# Patient Record
Sex: Male | Born: 1965 | Race: White | Hispanic: No | Smoking: Current some day smoker
Health system: Southern US, Community
[De-identification: ages and names within clinical notes are randomized; demographics above are authoritative.]

## PROBLEM LIST (undated history)

## (undated) DIAGNOSIS — K219 Gastro-esophageal reflux disease without esophagitis: Secondary | ICD-10-CM

## (undated) DIAGNOSIS — I10 Essential (primary) hypertension: Secondary | ICD-10-CM

## (undated) DIAGNOSIS — G4730 Sleep apnea, unspecified: Secondary | ICD-10-CM

## (undated) DIAGNOSIS — E785 Hyperlipidemia, unspecified: Secondary | ICD-10-CM

## (undated) DIAGNOSIS — E119 Type 2 diabetes mellitus without complications: Secondary | ICD-10-CM

## (undated) DIAGNOSIS — C6290 Malignant neoplasm of unspecified testis, unspecified whether descended or undescended: Secondary | ICD-10-CM

## (undated) HISTORY — PX: BACK SURGERY: SHX140

## (undated) HISTORY — DX: Gastro-esophageal reflux disease without esophagitis: K21.9

## (undated) HISTORY — DX: Hyperlipidemia, unspecified: E78.5

## (undated) HISTORY — PX: TESTICLE REMOVAL: SHX68

---

## 2003-06-06 ENCOUNTER — Ambulatory Visit (HOSPITAL_COMMUNITY): Admission: RE | Admit: 2003-06-06 | Discharge: 2003-06-07 | Payer: Self-pay | Admitting: Neurosurgery

## 2004-10-13 ENCOUNTER — Ambulatory Visit: Payer: Self-pay | Admitting: Family Medicine

## 2006-08-09 ENCOUNTER — Emergency Department: Payer: Self-pay | Admitting: Emergency Medicine

## 2006-10-05 ENCOUNTER — Ambulatory Visit: Payer: Self-pay | Admitting: Family Medicine

## 2006-10-19 ENCOUNTER — Ambulatory Visit: Payer: Self-pay | Admitting: Family Medicine

## 2011-07-26 ENCOUNTER — Ambulatory Visit: Payer: Self-pay | Admitting: Family Medicine

## 2011-08-04 ENCOUNTER — Ambulatory Visit: Payer: Self-pay | Admitting: Urology

## 2013-07-18 IMAGING — US US PELVIS LIMITED
1 series · 16 of 25 positions shown · non-contrast
Comparison: none

REASON FOR EXAM: CR  [id number]  rt testicular mass swelling pain
COMMENTS:

PROCEDURE:     US  - US TESTICULAR  - July 26, 2011  [DATE]
RESULT:
TECHNIQUE: Grayscale, color-flow Doppler, and SPECTRAL waveform imaging was
performed.

[Series 1: us pelvis limited · 16 of 72 slices shown]
[im 1/72]
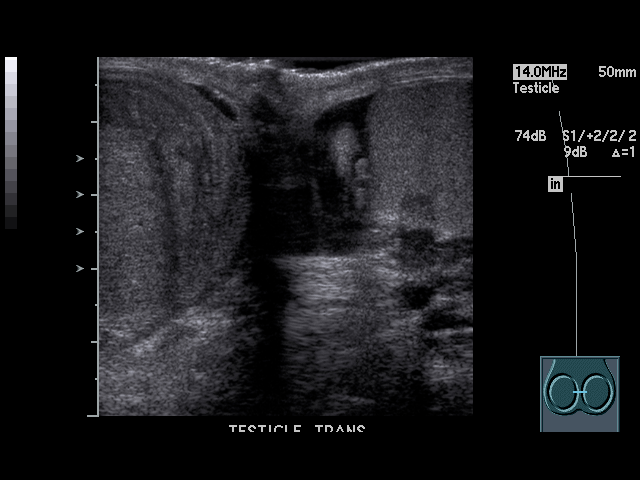
[im 6/72]
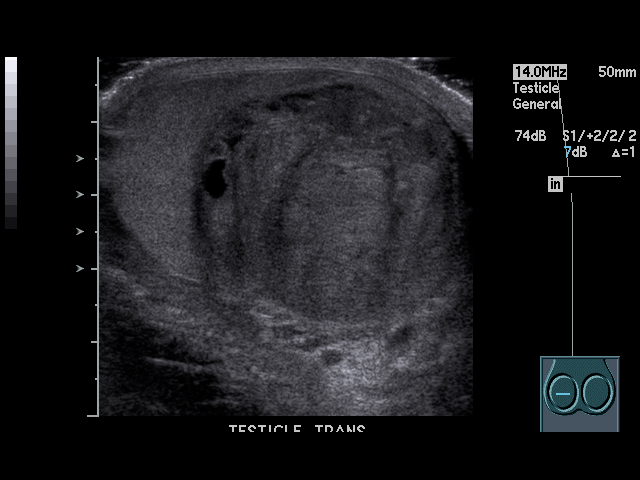
[im 9/72]
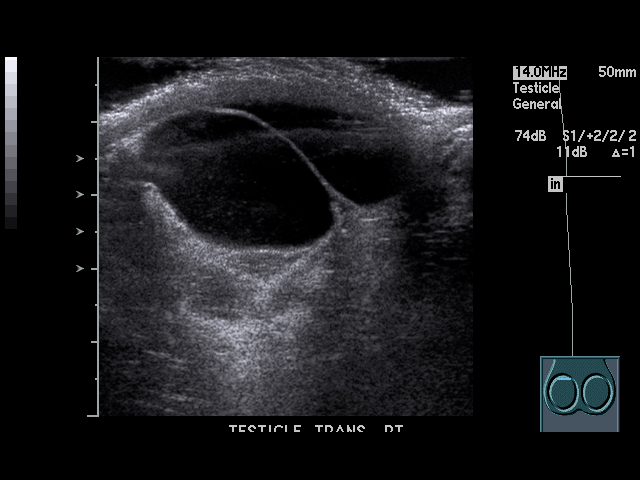
[im 15/72]
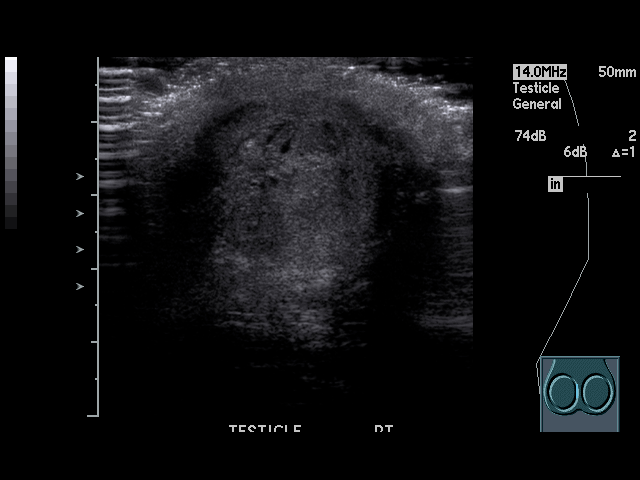
[im 21/72]
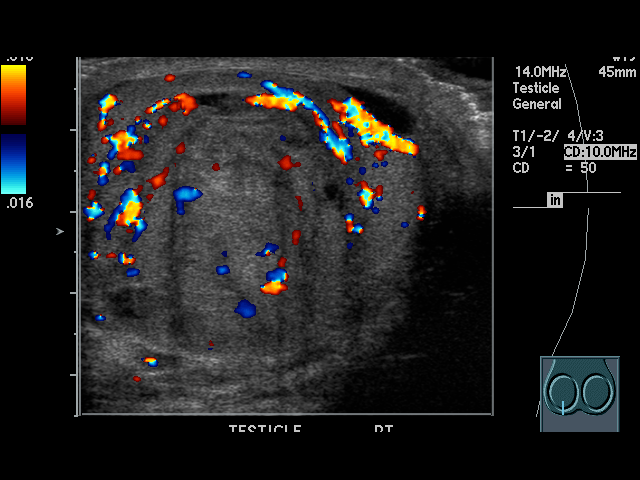
[im 24/72]
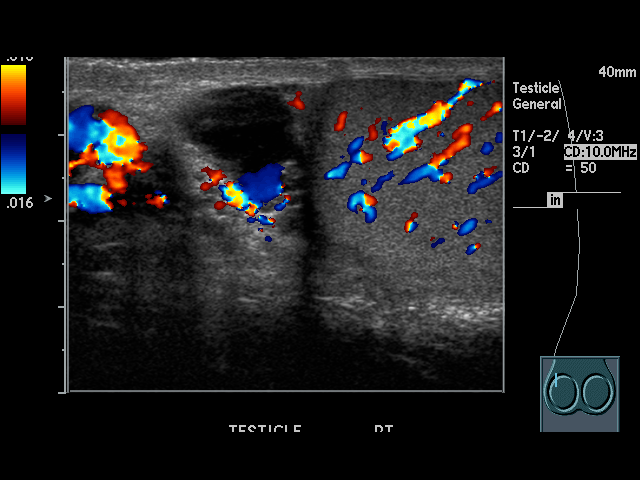
[im 30/72]
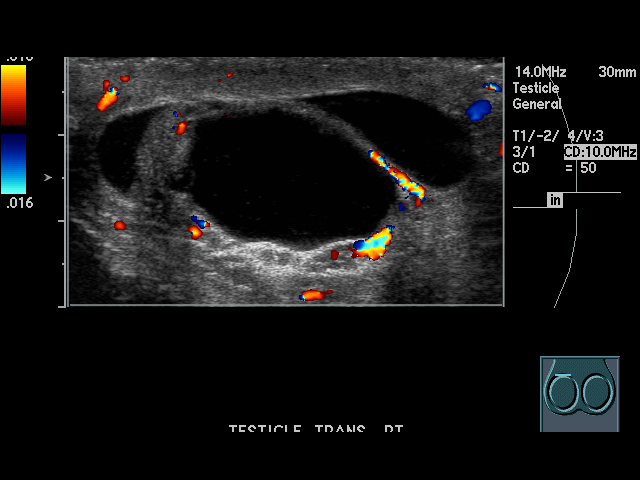
[im 33/72]
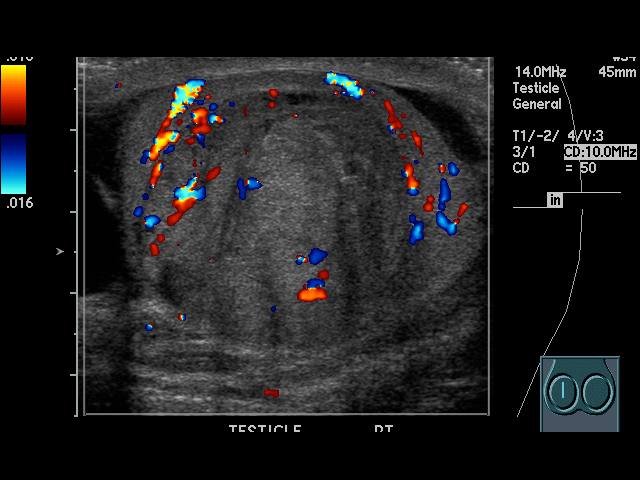
[im 39/72]
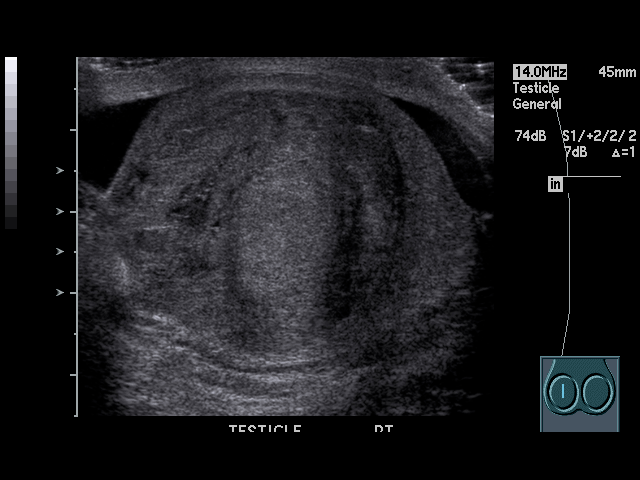
[im 42/72]
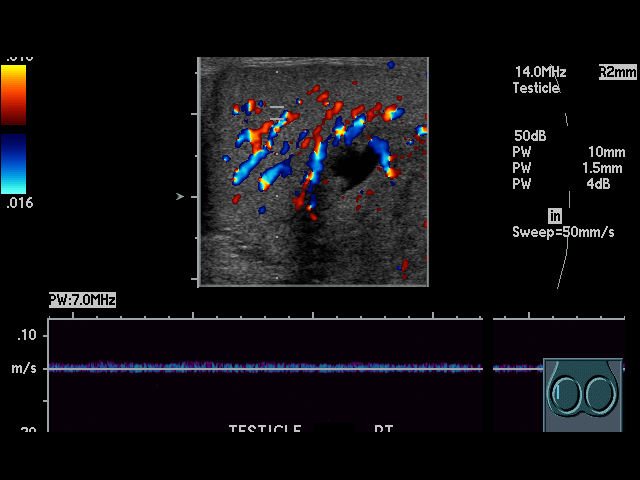
[im 48/72]
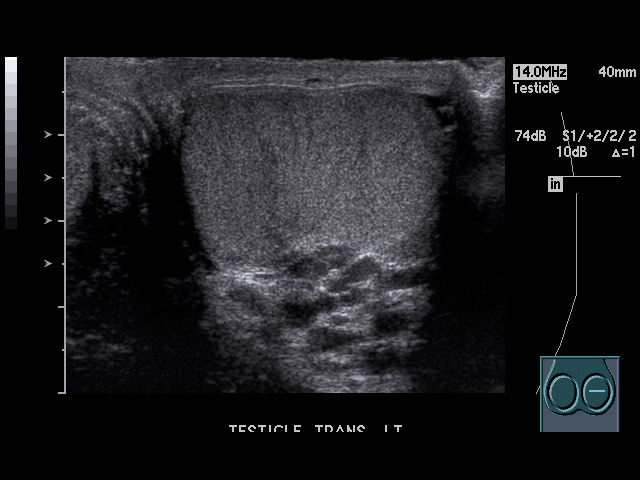
[im 51/72]
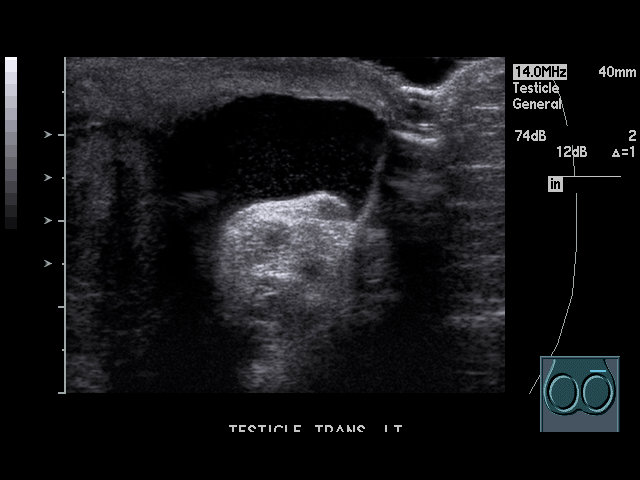
[im 57/72]
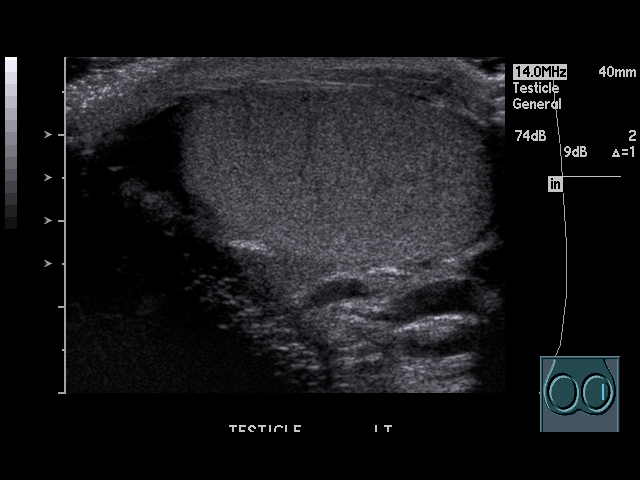
[im 63/72]
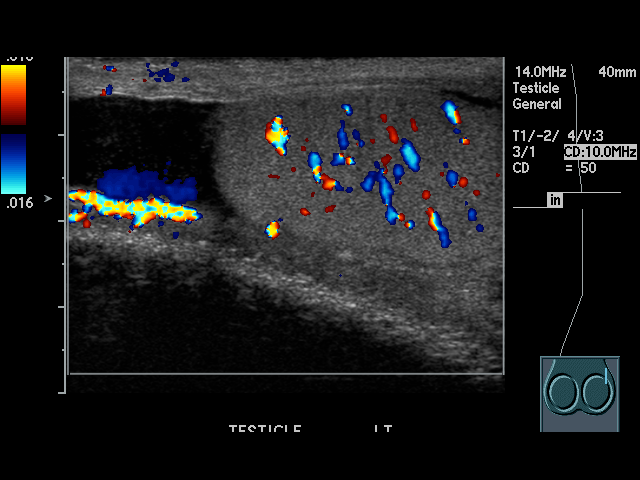
[im 66/72]
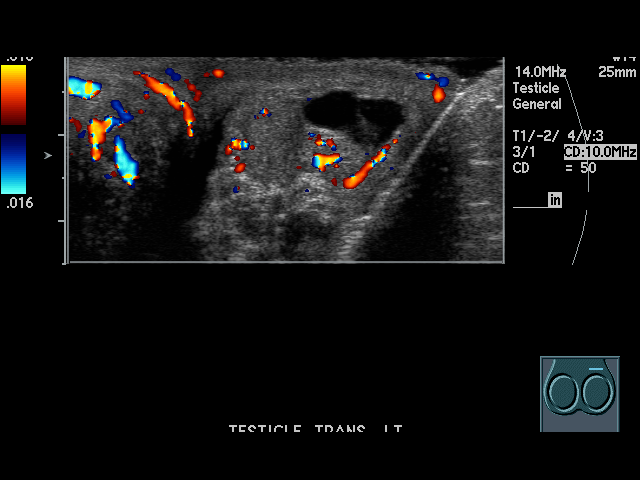
[im 72/72]
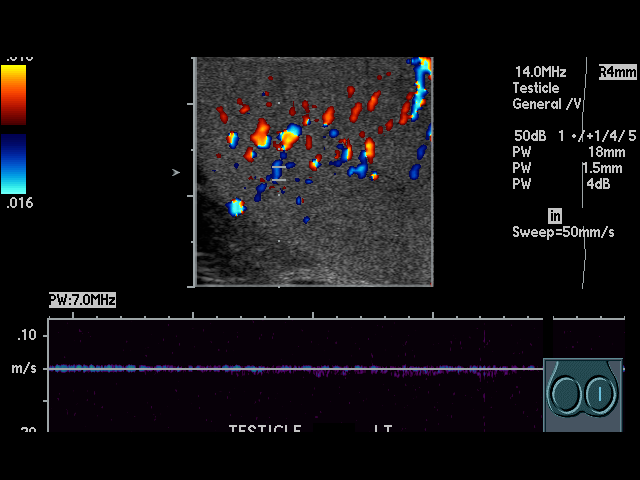

[16 of 25 positions shown; findings below may reference images not displayed]

FINDINGS: The right testicle is enlarged measuring 4.6 x 4.14 x 3.7 cm. A
complicated mass is appreciated within the paracentral portion of the
testicle measuring 3.88 x 3.56 x 3.31 cm. Vascularity is identified within
the mass. There are hypoechoic areas within the mass which may represent
cystic components or possibly sequela of involuting blood products.
Differential considerations are etiologies such as a hematoma particularly
if the patient's has a known history of trauma and infection if clinical
signs and symptoms suggest an infectious etiology. More ominous etiology
such as neoplastic disease benign versus malignant are also of diagnostic
consideration. Urologic consultation is recommended.

The surrounding testicular parenchyma has a more normal appearance. A very
small hydrocele is identified. The epididymis measures 1.06 x 1.38 cm and
contains an epididymal cyst measuring 1.7 x 1.96 x 2.3 cm.

The left hemiscrotum demonstrates a more normal-appearing left testicle with
a homogeneous echotexture and dimensions of 4.26 x 2.27 x 2.25 cm. A small
to moderate hydrocele is identified on the left. The left epididymis
measures 1.12 x 1.78 cm. An epididymal cyst is appreciated measuring 1.02 x
0.58 x 0.5 cm. Homogeneous color flow is identified within the left testicle
as well as arterial and venous waveforms.
IMPRESSION: 1. Right testicular mass with vascularity as described above. Differential
considerations are infection or hematoma in the appropriate clinical
setting. More ominous etiologies cannot be excluded and clinical correlation
as well as urologic consultation recommended.
2. Small bilateral hydroceles.
3. Bilateral epididymal cysts left greater than right.

These findings were telephoned to Dr. [REDACTED] on 07/26/2011 at [DATE]
p.m. to be relayed to Dr. Deziel.

## 2014-09-01 NOTE — Op Note (Signed)
PATIENT NAME:  Glen Glen [name], Glen Glen [name] MR#:  [id number] DATE OF BIRTH:  08-31-1965  DATE OF PROCEDURE:  08/04/2011  PREOPERATIVE DIAGNOSES: Intraparenchymal right testis mass.  POSTOPERATIVE DIAGNOSIS: Intraparenchymal right testis mass.  PROCEDURE: Right radical orchiectomy.  SURGEON: Glen AxonScott Stoioff, MD  ASSISTANT: None.  ANESTHESIA: General.   INDICATION: This is Glen [name] 49 year old male who presented with Glen [name] one month history of mild right testicular pain and approximately two weeks ago noted Glen [name] mass in the right hemiscrotum. There is no previous history of trauma, epididymitis, or orchitis. Physical examination is remarkable for Glen [name] firm mass within the testis proper. Glen [name] testicular ultrasound was remarkable for Glen [name] heterogeneous mass within the paracentral portion of the right testis measuring 3.88 x 3.56 x 3.31 cm. There was vascularity present within the mass by Doppler. Glen [name] metabolic comprehensive metabolic panel showed no significant abnormalities. Beta HCG was elevated at 88 mIU/ml. Alpha-fetoprotein was elevated at 77 ng/mL. He presents for right radical orchiectomy.  DESCRIPTION OF PROCEDURE: The correct site was marked in the staging area. He was then taken to the operating room where Glen [name] general anesthetic was administered via Glen [name] LMA. He was placed in the supine position. His external genitalia and lower abdomen were prepped and draped sterilely. Time out was performed per protocol. An incision was made in the right groin region overlying the approximate area of the internal inguinal ring. The incision was carried through the subcutaneous tissue and Scarpa's fascia down to the external oblique fascia. The external inguinal ring was identified. The external oblique fascia was incised in the direction of its fibers and opened through the external ring. The ilioinguinal nerve was identified, dissected free and retracted laterally with the fascia of the external oblique. At the pubic tubercle, the spermatic cord was  mobilized. Glen [name] was passed beneath it.  The Penrose drain was then doubly placed around the cord and then clamped to achieve compression of the cord. Glen [name] was then inserted through the inguinal incision down into the scrotum and the area was bluntly dissected. The scrotum was then invaginated and testis was delivered into the operative field. Gubernacular fibers were divided. There was one vessel which was ligated with Glen [name] 3-0 Vicryl suture. The cord was then mobilized to the internal inguinal ring. Intraoperative exam was remarkable for Glen [name] firm mass within the right testis. The tunica vaginalis was not opened. Vas was dissected free from the cord at the internal inguinal ring and ligated with 0 Ethibond suture. The remainder of the cord was ligated with 0 Ethibond suture ligatures x2. The cord stump was then pushed back up into the retroperitoneum. The site was irrigated. No bleeding was noted. Hemostasis was adequate. The ilioinguinal nerve was returned to its anatomic position. The external oblique fascia was closed with Glen [name] running 3-0 Vicryl suture. The Scarpa fascia and subcutaneous tissue was closed with interrupted 3-0 Vicryl. The skin was anesthetized with 0.5% plain Sensorcaine. The skin was closed with Glen [name] running subcuticular suture of 5-0 Vicryl. Steri-Strips, Telfa, and Tegaderm were applied. The patient was taken to the PAC-U in stable condition. There were no complications. EBL was minimal.  ____________________________ Glen CzechScott C. Lonna CobbStoioff, MD scs:slb D: 08/04/2011 19:05:16 ET T: 08/05/2011 09:31:54 ET JOB#: [id number]  cc: Glen PicketScott C. Lonna CobbStoioff, MD, <Dictator> Glen CzechSCOTT C STOIOFF MD ELECTRONICALLY SIGNED 08/11/2011 12:00

## 2017-04-04 ENCOUNTER — Encounter: Payer: Self-pay | Admitting: Dietician

## 2017-04-04 ENCOUNTER — Encounter: Payer: BLUE CROSS/BLUE SHIELD | Attending: Family Medicine | Admitting: Dietician

## 2017-04-04 VITALS — BP 118/82 | Ht 72.0 in | Wt 213.3 lb

## 2017-04-04 DIAGNOSIS — E119 Type 2 diabetes mellitus without complications: Secondary | ICD-10-CM

## 2017-04-04 DIAGNOSIS — Z713 Dietary counseling and surveillance: Secondary | ICD-10-CM | POA: Diagnosis not present

## 2017-04-04 NOTE — Progress Notes (Signed)
Diabetes Self-Management Education  Visit Type: First/Initial  Appt. Start Time: 0900 Appt. End Time: 1000  04/04/2017  [name], identified by name and date of birth, is a 51 y.o. male with a diagnosis of Diabetes: Type 2.   ASSESSMENT  Blood pressure 118/82, height 6' (1.829 m), weight 213 lb 4.8 oz (96.8 kg). Body mass index is 28.93 kg/m.  Diabetes Self-Management Education - 04/04/17 1453      Visit Information   Visit Type  First/Initial      Initial Visit   Diabetes Type  Type 2      Health Coping   How would you rate your overall health?  Good      Psychosocial Assessment   Patient Belief/Attitude about Diabetes  Motivated to manage diabetes    Self-care barriers  None    Self-management support  Doctor's office;Family    Other persons present  Patient    Patient Concerns  Weight Control;Medication;Healthy Lifestyle;Glycemic Control become more fit, prevent complications    Special Needs  None    Preferred Learning Style  Visual;Hands on;Auditory    Learning Readiness  Ready    What is the last grade level you completed in school?  2 yr college      Pre-Education Assessment   Patient understands the diabetes disease and treatment process.  Needs Instruction    Patient understands incorporating nutritional management into lifestyle.  Needs Instruction    Patient undertands incorporating physical activity into lifestyle.  Needs Instruction    Patient understands using medications safely.  Needs Instruction    Patient understands monitoring blood glucose, interpreting and using results  Needs Review has Contour Next meter and checks BG's 1-2x/day    Patient understands prevention, detection, and treatment of acute complications.  Needs Instruction    Patient understands prevention, detection, and treatment of chronic complications.  Needs Instruction    Patient understands how to develop strategies to address psychosocial issues.  Needs Instruction    Patient understands how to develop strategies to promote health/change behavior.  Needs Instruction      Complications   Last HgB A1C per patient/outside source  8.5 % 03-08-17    How often do you check your blood sugar?  1-2 times/day    Fasting Blood glucose range (mg/dL)  [id number]    Postprandial Blood glucose range (mg/dL)  130-865;784-696;>295130-179;180-200;>200    Have you had a dilated eye exam in the past 12 months?  No 12-2014    Have you had a dental exam in the past 12 months?  No 2 yr ago    Are you checking your feet?  Yes    How many days per week are you checking your feet?  2      Dietary Intake   Breakfast  eats breakfast 7a-8a; has made recent diet improvements eating less sweets and fried foods and now drinks no drinks with sugar    Snack (morning)  eats nuts or fruit at 9:30a    Lunch  eats lunch at 12p    Snack (afternoon)  eats nuts or fruit at 3p    Dinner  eats super at 5p-6p    Snack (evening)  eats occasional bedtime snack-cereal and milk    Beverage(s)  drinks water 2-3x/day, unsweetened cpffee . doet spdas 2-3x/day and milk 0-1x/day      Exercise   Exercise Type  Light (walking / raking leaves);Strenuous (running)    How many days per week to you exercise?  4.5    How many minutes per day do you exercise?  45    Total minutes per week of exercise  202.5      Patient Education   Previous Diabetes Education  No    Disease state   Definition of diabetes, type 1 and 2, and the diagnosis of diabetes;Explored patient's options for treatment of their diabetes    Nutrition management   Role of diet in the treatment of diabetes and the relationship between the three main macronutrients and blood glucose level;Carbohydrate counting;Meal timing in regards to the patients' current diabetes medication.    Physical activity and exercise   Role of exercise on diabetes management, blood pressure control and cardiac health.;Helped patient identify appropriate exercises in relation to his/her  diabetes, diabetes complications and other health issue.    Medications  Reviewed patients medication for diabetes, action, purpose, timing of dose and side effects.    Monitoring  Purpose and frequency of SMBG.;Interpreting lab values - A1C, lipid, urine microalbumina.;Taught/discussed recording of test results and interpretation of SMBG.;Identified appropriate SMBG and/or A1C goals.;Yearly dilated eye exam    Chronic complications  Relationship between chronic complications and blood glucose control;Retinopathy and reason for yearly dilated eye exams;Lipid levels, blood glucose control and heart disease;Dental care;Nephropathy, what it is, prevention of, the use of ACE, ARB's and early detection of through urine microalbumia.;Reviewed with patient heart disease, higher risk of, and prevention    Psychosocial adjustment  Role of stress on diabetes    Personal strategies to promote health  Lifestyle issues that need to be addressed for better diabetes care;Review risk of smoking and offered smoking cessation;Helped patient develop diabetes management plan for (enter comment)       Individualized Plan for Diabetes Self-Management Training:   Learning Objective:  Patient will have a greater understanding of diabetes self-management. Patient education plan is to attend individual and/or group sessions per assessed needs and concerns.   Plan:   Patient Instructions   Check blood sugars 2 x day before breakfast and 2 hrs after supper every day and record Bring blood sugar records to the next appointment/class Call your doctor for a prescription for:  1. Meter strips (type): Contour Next test strips checking  2 times per day  2. Lancets (type):Microlet lancets checking  2   times per day Exercise:  continue run/walk for    30-60  minutes   4-5 days a week Eat 3 meals day,   1-2  snacks a day Eat 3-4 carbohydrate servings/meal + protein Eat 1 carbohydrate serving/snack + protein Space meals 4-5  hours apart Avoid sugar sweetened drinks (soda, tea, coffee, sports drinks, juices) Limit intake of sweets/desserts and fried foods Limit alcohol:  No more than  2  drinks a day Make dentist / eye doctor appointments Get a Sharps container Encouraged to stop using snuff and smoking cigars Return for appointment/classes on:  05-12-16   Expected Outcomes:   positive  Education material provided: General meal planning Guidelines  If problems or questions, patient to contact team via: [phone]  Future DSME appointment:  05-12-17

## 2017-04-04 NOTE — Patient Instructions (Addendum)
  Check blood sugars 2 x day before breakfast and 2 hrs after supper every day and record Bring blood sugar records to the next appointment/class Call your doctor for a prescription for:  1. Meter strips (type): Contour Next test strips  checking  2 times per day  2. Lancets (type):Microlet lancets checking  2   times per day Exercise:  continue run/walk for    30-60  minutes   4-5 days a week Eat 3 meals day,   1-2  snacks a day Eat 3-4 carbohydrate servings/meal + protein Eat 1 carbohydrate serving/snack + protein Space meals 4-5 hours apart Avoid sugar sweetened drinks (soda, tea, coffee, sports drinks, juices) Limit intake of sweets/desserts and fried foods Limit alcohol:  No more than  2  drinks a day Make dentist / eye doctor appointments Get a Sharps container Return for appointment/classes on:  05-12-16

## 2017-05-12 ENCOUNTER — Telehealth: Payer: Self-pay | Admitting: Dietician

## 2017-05-12 ENCOUNTER — Ambulatory Visit: Payer: BLUE CROSS/BLUE SHIELD

## 2017-05-12 NOTE — Telephone Encounter (Signed)
Patient did not show for his first Diabetes class. Called and left message requesting him to call and reschedule.

## 2017-05-19 ENCOUNTER — Ambulatory Visit: Payer: BLUE CROSS/BLUE SHIELD

## 2017-05-26 ENCOUNTER — Ambulatory Visit: Payer: BLUE CROSS/BLUE SHIELD

## 2017-05-30 ENCOUNTER — Encounter: Payer: Self-pay | Admitting: Dietician

## 2017-05-30 NOTE — Progress Notes (Signed)
Have not heard from pt-called pt but no answer and unable to leave a message

## 2017-06-13 ENCOUNTER — Encounter: Payer: Self-pay | Admitting: Dietician

## 2018-05-28 ENCOUNTER — Other Ambulatory Visit: Payer: Self-pay

## 2018-05-28 ENCOUNTER — Emergency Department: Payer: BLUE CROSS/BLUE SHIELD

## 2018-05-28 ENCOUNTER — Emergency Department
Admission: EM | Admit: 2018-05-28 | Discharge: 2018-05-28 | Disposition: A | Discharge status: Home / Self Care | Payer: BLUE CROSS/BLUE SHIELD | Attending: Emergency Medicine | Admitting: Emergency Medicine

## 2018-05-28 DIAGNOSIS — E785 Hyperlipidemia, unspecified: Secondary | ICD-10-CM | POA: Insufficient documentation

## 2018-05-28 DIAGNOSIS — Z79899 Other long term (current) drug therapy: Secondary | ICD-10-CM | POA: Insufficient documentation

## 2018-05-28 DIAGNOSIS — F17210 Nicotine dependence, cigarettes, uncomplicated: Secondary | ICD-10-CM | POA: Insufficient documentation

## 2018-05-28 DIAGNOSIS — E119 Type 2 diabetes mellitus without complications: Secondary | ICD-10-CM | POA: Insufficient documentation

## 2018-05-28 DIAGNOSIS — I10 Essential (primary) hypertension: Secondary | ICD-10-CM | POA: Diagnosis not present

## 2018-05-28 DIAGNOSIS — R002 Palpitations: Secondary | ICD-10-CM | POA: Insufficient documentation

## 2018-05-28 LAB — URINE DRUG SCREEN, QUALITATIVE (ARMC ONLY)
Amphetamines, Ur Screen: NOT DETECTED
BARBITURATES, UR SCREEN: NOT DETECTED
Benzodiazepine, Ur Scrn: NOT DETECTED
COCAINE METABOLITE, UR ~~LOC~~: NOT DETECTED
Cannabinoid 50 Ng, Ur ~~LOC~~: NOT DETECTED
MDMA (ECSTASY) UR SCREEN: NOT DETECTED
METHADONE SCREEN, URINE: NOT DETECTED
OPIATE, UR SCREEN: NOT DETECTED
Phencyclidine (PCP) Ur S: NOT DETECTED
TRICYCLIC, UR SCREEN: NOT DETECTED

## 2018-05-28 LAB — COMPREHENSIVE METABOLIC PANEL
ALK PHOS: 99 U/L (ref 38–126)
ALT: 29 U/L (ref 0–44)
ANION GAP: 7 (ref 5–15)
AST: 25 U/L (ref 15–41)
Albumin: 4.1 g/dL (ref 3.5–5.0)
BILIRUBIN TOTAL: 0.8 mg/dL (ref 0.3–1.2)
BUN: 13 mg/dL (ref 6–20)
CALCIUM: 8.6 mg/dL — AB (ref 8.9–10.3)
CO2: 25 mmol/L (ref 22–32)
Chloride: 104 mmol/L (ref 98–111)
Creatinine, Ser: 0.78 mg/dL (ref 0.61–1.24)
Glucose, Bld: 263 mg/dL — ABNORMAL HIGH (ref 70–99)
POTASSIUM: 4.1 mmol/L (ref 3.5–5.1)
Sodium: 136 mmol/L (ref 135–145)
TOTAL PROTEIN: 7.3 g/dL (ref 6.5–8.1)

## 2018-05-28 LAB — URINALYSIS, COMPLETE (UACMP) WITH MICROSCOPIC
BILIRUBIN URINE: NEGATIVE
Bacteria, UA: NONE SEEN
Glucose, UA: >=500 mg/dL — AB
HGB URINE DIPSTICK: NEGATIVE
Ketones, ur: 5 mg/dL — AB
Leukocytes, UA: NEGATIVE
Nitrite: NEGATIVE
PH: 6 (ref 5.0–8.0)
Protein, ur: NEGATIVE mg/dL
SPECIFIC GRAVITY, URINE: 1.032 — AB (ref 1.005–1.030)
Squamous Epithelial / LPF: NONE SEEN (ref 0–5)

## 2018-05-28 LAB — CBC WITH DIFFERENTIAL/PLATELET
Abs Immature Granulocytes: 0.01 10*3/uL (ref 0.00–0.07)
BASOS ABS: 0 10*3/uL (ref 0.0–0.1)
Basophils Relative: 0 %
EOS ABS: 0.3 10*3/uL (ref 0.0–0.5)
Eosinophils Relative: 4 %
HEMATOCRIT: 44.5 % (ref 39.0–52.0)
Hemoglobin: 14.3 g/dL (ref 13.0–17.0)
Immature Granulocytes: 0 %
LYMPHS ABS: 2.5 10*3/uL (ref 0.7–4.0)
Lymphocytes Relative: 34 %
MCH: 27.9 pg (ref 26.0–34.0)
MCHC: 32.1 g/dL (ref 30.0–36.0)
MCV: 86.9 fL (ref 80.0–100.0)
MONOS PCT: 8 %
Monocytes Absolute: 0.6 10*3/uL (ref 0.1–1.0)
NRBC: 0 % (ref 0.0–0.2)
Neutro Abs: 3.9 10*3/uL (ref 1.7–7.7)
Neutrophils Relative %: 54 %
Platelets: 267 10*3/uL (ref 150–400)
RBC: 5.12 MIL/uL (ref 4.22–5.81)
RDW: 11.6 % (ref 11.5–15.5)
WBC: 7.3 10*3/uL (ref 4.0–10.5)

## 2018-05-28 LAB — TSH: TSH: 3.12 u[IU]/mL (ref 0.350–4.500)

## 2018-05-28 LAB — T4, FREE: FREE T4: 0.96 ng/dL (ref 0.82–1.77)

## 2018-05-28 LAB — TROPONIN I: Troponin I: <0.03 ng/mL (ref <0.03)

## 2018-05-28 MED ORDER — ATENOLOL 25 MG PO TABS
25.0000 mg | ORAL_TABLET | Freq: Once | ORAL | Status: AC
  Administered 2018-05-28: 25 mg via ORAL
  Filled 2018-05-28: qty 1

## 2018-05-28 MED ORDER — ATENOLOL 25 MG PO TABS: 25.0000 mg | ORAL_TABLET | Freq: Every day | ORAL | 0 refills | Status: AC

## 2018-05-28 NOTE — ED Notes (Signed)
Pt to xray

## 2018-05-28 NOTE — ED Provider Notes (Signed)
[facility] Emergency Department Provider Note   ____________________________________________   First MD Initiated Contact with Patient 05/28/18 [phone]     (approximate)  I have reviewed the triage vital signs and the nursing notes.   HISTORY  Chief Complaint Palpitations    HPI ADRIN [name] is a 53 y.o. male who presents to the ED from home with a chief complaint of palpitations and elevated blood pressure.  Patient has a history of hypertension, diabetes and hyperlipidemia but only takes medicines for his cholesterol as he has weaned himself off his other medicines.  Complains of a 2-day history of palpitations.  Tonight he was feeling palpitations and then a flushed feeling and some tightness in his chest.  Checked his blood pressure which was systolic 170s.  Denies associated diaphoresis, shortness of breath, dizziness, nausea or vomiting.  Drinks 1 cup of coffee every day and sometimes soda.  Denies energy drinks or herbal medications.  Had a cold since Christmas but has not been using decongestants.  Nonproductive cough remains.  Denies associated fever, chills, abdominal pain, dysuria, diarrhea.  Denies recent travel, trauma or hormone use.  Did eat spicy foods for dinner.   Past Medical History:  Diagnosis Date  . GERD (gastroesophageal reflux disease)   . Hyperlipidemia     There are no active problems to display for this patient.   No past surgical history on file.  Prior to Admission medications   Medication Sig Start Date End Date Taking? Authorizing Provider  atenolol (TENORMIN) 25 MG tablet Take 1 tablet (25 mg total) by mouth daily. 05/28/18   Irean Hong, MD  esomeprazole (NEXIUM) 40 MG capsule Take 1 capsule by mouth daily.    [provider]  metFORMIN (GLUCOPHAGE-XR) 500 MG 24 hr tablet Take 2 tablets by mouth daily with supper. 03/30/17   [provider]  rosuvastatin (CRESTOR) 10 MG tablet Take 1 tablet by mouth  daily. 03/10/17   [provider]    Allergies Patient has no known allergies.  Family history CAD  Social History Social History   Tobacco Use  . Smoking status: Current Some Day Smoker    Types: Cigars  . Smokeless tobacco: Current User    Types: Snuff  . Tobacco comment: smokes only rare cigar  Substance Use Topics  . Alcohol use: Yes    Alcohol/week: 4.0 - 5.0 standard drinks    Types: 4 - 5 Shots of liquor per week  . Drug use: Not on file    Review of Systems  Constitutional: No fever/chills Eyes: No visual changes. ENT: No sore throat. Cardiovascular: Positive for chest pain and palpitations. Respiratory: Denies shortness of breath. Gastrointestinal: No abdominal pain.  No nausea, no vomiting.  No diarrhea.  No constipation. Genitourinary: Negative for dysuria. Musculoskeletal: Negative for back pain. Skin: Negative for rash. Neurological: Negative for headaches, focal weakness or numbness.   ____________________________________________   PHYSICAL EXAM:  VITAL SIGNS: ED Triage Vitals  Enc Vitals Group     BP 05/28/18 0044 (!) 182/110     Pulse Rate 05/28/18 0044 97     Resp 05/28/18 0044 14     Temp 05/28/18 0044 98.8 F (37.1 C)     Temp Source 05/28/18 0044 Oral     SpO2 05/28/18 0044 97 %     Weight 05/28/18 0043 215 lb (97.5 kg)     Height 05/28/18 0043 6' (1.829 m)     Head Circumference --  Peak Flow --      Pain Score 05/28/18 0042 1     Pain Loc --      Pain Edu? --      Excl. in GC? --     Constitutional: Alert and oriented.  Anxious appearing and in no acute distress. Eyes: Conjunctivae are normal. PERRL. EOMI. Head: Atraumatic. Nose: No congestion/rhinnorhea. Mouth/Throat: Mucous membranes are moist.  Oropharynx non-erythematous. Neck: No stridor.  No thyromegaly. Cardiovascular: Normal rate, regular rhythm. Grossly normal heart sounds.  Good peripheral circulation. Respiratory: Normal respiratory effort.  No  retractions. Lungs CTAB. Gastrointestinal: Soft and nontender. No distention. No abdominal bruits. No CVA tenderness. Musculoskeletal: No lower extremity tenderness nor edema.  No joint effusions. Neurologic:  Normal speech and language. No gross focal neurologic deficits are appreciated. No gait instability. Skin:  Skin is warm, dry and intact. No rash noted. Psychiatric: Mood and affect are normal. Speech and behavior are normal.  ____________________________________________   LABS (all labs ordered are listed, but only abnormal results are displayed)  Labs Reviewed  COMPREHENSIVE METABOLIC PANEL - Abnormal; Notable for the following components:      Result Value   Glucose, Bld 263 (*)    Calcium 8.6 (*)    All other components within normal limits  URINALYSIS, COMPLETE (UACMP) WITH MICROSCOPIC - Abnormal; Notable for the following components:   Color, Urine YELLOW (*)    APPearance CLEAR (*)    Specific Gravity, Urine 1.032 (*)    Glucose, UA >=500 (*)    Ketones, ur 5 (*)    All other components within normal limits  CBC WITH DIFFERENTIAL/PLATELET  TROPONIN I  TSH  T4, FREE  URINE DRUG SCREEN, QUALITATIVE ([facility] ONLY)  TROPONIN I   ____________________________________________  EKG  ED ECG REPORT I, Omere Marti J, the attending physician, personally viewed and interpreted this ECG.   Date: 05/28/2018  EKG Time: 0046  Rate: 98  Rhythm: normal EKG, normal sinus rhythm  Axis: Normal  Intervals:none  ST&T Change: Nonspecific  ____________________________________________  RADIOLOGY  ED MD interpretation: No acute cardiopulmonary process  Official radiology report(s): Dg Chest 2 View  Result Date: 05/28/2018 CLINICAL DATA:  Palpitations. Chest discomfort. EXAM: CHEST - 2 VIEW COMPARISON:  None. FINDINGS: Mild hyperinflation. Lower cervical spine fixation. Numerous leads and wires project over the chest. Midline trachea. Normal heart size and mediastinal contours. No  pleural effusion or pneumothorax. Clear lungs. No congestive failure. IMPRESSION: No acute cardiopulmonary disease. Electronically Signed   By: Jeronimo GreavesKyle  Talbot M.D.   On: 05/28/2018 01:31    ____________________________________________   PROCEDURES  Procedure(s) performed: None  Procedures  Critical Care performed: No  ____________________________________________   INITIAL IMPRESSION / ASSESSMENT AND PLAN / ED COURSE  As part of my medical decision making, I reviewed the following data within the electronic MEDICAL RECORD NUMBER History obtained from family, Nursing notes reviewed and incorporated, Labs reviewed, EKG interpreted, Old chart reviewed, Radiograph reviewed and Notes from prior ED visits   53 year old male with diabetes and hyperlipidemia who presents with palpitations and elevated blood pressure.  Differential diagnosis includes but is not limited to ACS, thyroid dysfunction, infectious etiology, etc.  Will obtain screening lab work including thyroid panel and troponin, urinalysis, chest x-ray.  Monitor blood pressure and consider antihypertensives.  Clinical Course as of May 28 534  Sun May 28, 2018  0133 Patient resting in no acute distress.  Blood pressure currently 142/95, heart rate 90, room air saturation 99%.   [JS]  1610 Updated patient and spouse of test results thus far.  Nurse drawing times repeat troponin.  Blood pressure currently 129/93.  Voices no complaints.   [JS]  0402 Repeat timed troponin remains unremarkable.  Discussed with patient and he desires me to prescribe antihypertensive.  I also told him he should probably resume his metformin.  He will speak to his doctor about this.  Also asked him to keep a blood pressure log to bring to his PCP.  Strict return precautions given.  Patient and spouse verbalize understanding and agree with plan of care.   [JS]    Clinical Course User Index [JS] Irean Hong, MD      ____________________________________________   FINAL CLINICAL IMPRESSION(S) / ED DIAGNOSES  Final diagnoses:  Palpitations  Essential hypertension     ED Discharge Orders         Ordered    atenolol (TENORMIN) 25 MG tablet  Daily     05/28/18 0415           Note:  This document was prepared using Dragon voice recognition software and may include unintentional dictation errors.    Irean Hong, MD 05/28/18 [phone]

## 2018-05-28 NOTE — ED Triage Notes (Signed)
Reports symptoms since Friday with palpations and off/on Saturday.  Tonight having palpations and then a "heat/flushed feeling" and checked BP and it was elevated and felt uncomfortable in the chest.

## 2018-05-28 NOTE — Discharge Instructions (Signed)
Start Atenolol 25mg  daily for your blood pressure. Please keep a log of your blood pressures morning, noon and evening. Take this to your doctor. Also speak with your doctor about restarting diabetes medicine. Return to the ER for worsening symptoms, persistent vomiting, difficulty breathing or other concerns.

## 2018-05-28 NOTE — ED Notes (Signed)
Pt returned from xray

## 2022-12-07 ENCOUNTER — Encounter: Payer: Self-pay | Admitting: Gastroenterology

## 2022-12-13 ENCOUNTER — Encounter
Admission: RE | Disposition: A | Discharge status: Home / Self Care | Payer: Self-pay | Source: Home / Self Care | Attending: Gastroenterology

## 2022-12-13 ENCOUNTER — Ambulatory Visit: Payer: BC Managed Care – PPO | Admitting: Anesthesiology

## 2022-12-13 ENCOUNTER — Ambulatory Visit
Admission: RE | Admit: 2022-12-13 | Discharge: 2022-12-13 | Disposition: A | Discharge status: Home / Self Care | Payer: BC Managed Care – PPO | Attending: Gastroenterology | Admitting: Gastroenterology

## 2022-12-13 ENCOUNTER — Encounter: Payer: Self-pay | Admitting: Gastroenterology

## 2022-12-13 DIAGNOSIS — E785 Hyperlipidemia, unspecified: Secondary | ICD-10-CM | POA: Insufficient documentation

## 2022-12-13 DIAGNOSIS — E119 Type 2 diabetes mellitus without complications: Secondary | ICD-10-CM | POA: Diagnosis not present

## 2022-12-13 DIAGNOSIS — Z1211 Encounter for screening for malignant neoplasm of colon: Secondary | ICD-10-CM | POA: Insufficient documentation

## 2022-12-13 DIAGNOSIS — F17200 Nicotine dependence, unspecified, uncomplicated: Secondary | ICD-10-CM | POA: Diagnosis not present

## 2022-12-13 DIAGNOSIS — D122 Benign neoplasm of ascending colon: Secondary | ICD-10-CM | POA: Insufficient documentation

## 2022-12-13 DIAGNOSIS — G4730 Sleep apnea, unspecified: Secondary | ICD-10-CM | POA: Insufficient documentation

## 2022-12-13 DIAGNOSIS — Z8547 Personal history of malignant neoplasm of testis: Secondary | ICD-10-CM | POA: Diagnosis not present

## 2022-12-13 DIAGNOSIS — K5730 Diverticulosis of large intestine without perforation or abscess without bleeding: Secondary | ICD-10-CM | POA: Diagnosis not present

## 2022-12-13 DIAGNOSIS — I10 Essential (primary) hypertension: Secondary | ICD-10-CM | POA: Insufficient documentation

## 2022-12-13 DIAGNOSIS — K635 Polyp of colon: Secondary | ICD-10-CM | POA: Diagnosis not present

## 2022-12-13 DIAGNOSIS — D128 Benign neoplasm of rectum: Secondary | ICD-10-CM | POA: Insufficient documentation

## 2022-12-13 DIAGNOSIS — K219 Gastro-esophageal reflux disease without esophagitis: Secondary | ICD-10-CM | POA: Diagnosis not present

## 2022-12-13 DIAGNOSIS — D123 Benign neoplasm of transverse colon: Secondary | ICD-10-CM | POA: Diagnosis not present

## 2022-12-13 DIAGNOSIS — Z7984 Long term (current) use of oral hypoglycemic drugs: Secondary | ICD-10-CM | POA: Insufficient documentation

## 2022-12-13 DIAGNOSIS — Z9079 Acquired absence of other genital organ(s): Secondary | ICD-10-CM | POA: Diagnosis not present

## 2022-12-13 HISTORY — DX: Type 2 diabetes mellitus without complications: E11.9

## 2022-12-13 HISTORY — PX: HEMOSTASIS CLIP PLACEMENT: SHX6857

## 2022-12-13 HISTORY — PX: POLYPECTOMY: SHX5525

## 2022-12-13 HISTORY — DX: Essential (primary) hypertension: I10

## 2022-12-13 HISTORY — PX: COLONOSCOPY WITH PROPOFOL: SHX5780

## 2022-12-13 HISTORY — DX: Sleep apnea, unspecified: G47.30

## 2022-12-13 HISTORY — DX: Malignant neoplasm of unspecified testis, unspecified whether descended or undescended: C62.90

## 2022-12-13 LAB — GLUCOSE, CAPILLARY: Glucose-Capillary: 158 mg/dL — ABNORMAL HIGH (ref 70–99)

## 2022-12-13 SURGERY — COLONOSCOPY WITH PROPOFOL
Anesthesia: General

## 2022-12-13 MED ORDER — LIDOCAINE HCL (CARDIAC) PF 100 MG/5ML IV SOSY
PREFILLED_SYRINGE | INTRAVENOUS | Status: DC | PRN
  Administered 2022-12-13: 80 mg via INTRAVENOUS

## 2022-12-13 MED ORDER — SODIUM CHLORIDE 0.9 % IV SOLN
INTRAVENOUS | Status: DC
  Administered 2022-12-13: 20 mL/h via INTRAVENOUS

## 2022-12-13 MED ORDER — DEXMEDETOMIDINE HCL IN NACL 80 MCG/20ML IV SOLN
INTRAVENOUS | Status: AC
  Filled 2022-12-13: qty 20

## 2022-12-13 MED ORDER — DEXMEDETOMIDINE HCL IN NACL 200 MCG/50ML IV SOLN
INTRAVENOUS | Status: DC | PRN
Start: 2022-12-13 — End: 2022-12-13
  Administered 2022-12-13: 12 ug via INTRAVENOUS
  Administered 2022-12-13: 8 ug via INTRAVENOUS

## 2022-12-13 MED ORDER — PROPOFOL 1000 MG/100ML IV EMUL
INTRAVENOUS | Status: AC
  Filled 2022-12-13: qty 100

## 2022-12-13 MED ORDER — PROPOFOL 500 MG/50ML IV EMUL
INTRAVENOUS | Status: DC | PRN
  Administered 2022-12-13: 125 ug/kg/min via INTRAVENOUS

## 2022-12-13 MED ORDER — LIDOCAINE HCL (PF) 2 % IJ SOLN
INTRAMUSCULAR | Status: AC
  Filled 2022-12-13: qty 5

## 2022-12-13 MED ORDER — PROPOFOL 10 MG/ML IV BOLUS
INTRAVENOUS | Status: DC | PRN
Start: 2022-12-13 — End: 2022-12-13
  Administered 2022-12-13: 50 mg via INTRAVENOUS
  Administered 2022-12-13: 20 mg via INTRAVENOUS

## 2022-12-13 NOTE — Anesthesia Preprocedure Evaluation (Addendum)
Anesthesia Evaluation  Patient identified by MRN, date of birth, ID band Patient awake    Reviewed: Allergy & Precautions, H&P , NPO status , Patient's Chart, lab work & pertinent test results  Airway Mallampati: II  TM Distance: >3 FB Neck ROM: full    Dental no notable dental hx.    Pulmonary sleep apnea , Current Smoker   Pulmonary exam normal        Cardiovascular hypertension, Normal cardiovascular exam     Neuro/Psych negative neurological ROS  negative psych ROS   GI/Hepatic Neg liver ROS,GERD  ,,  Endo/Other  diabetes    Renal/GU negative Renal ROS  negative genitourinary   Musculoskeletal   Abdominal Normal abdominal exam  (+)   Peds  Hematology negative hematology ROS (+)   Anesthesia Other Findings Past Medical History: No date: Diabetes mellitus without complication (HCC) No date: GERD (gastroesophageal reflux disease) No date: Hyperlipidemia No date: Hypertension No date: Sleep apnea No date: Testicular cancer [facility])  Past Surgical History: No date: BACK SURGERY No date: TESTICLE REMOVAL     Reproductive/Obstetrics negative OB ROS                              Anesthesia Physical Anesthesia Plan  ASA: 2  Anesthesia Plan: General   Post-op Pain Management:    Induction:   PONV Risk Score and Plan: Propofol infusion and TIVA  Airway Management Planned: Natural Airway  Additional Equipment:   Intra-op Plan:   Post-operative Plan:   Informed Consent: I have reviewed the patients History and Physical, chart, labs and discussed the procedure including the risks, benefits and alternatives for the proposed anesthesia with the patient or authorized representative who has indicated his/her understanding and acceptance.     Dental Advisory Given  Plan Discussed with: CRNA and Surgeon  Anesthesia Plan Comments:          Anesthesia Quick Evaluation

## 2022-12-13 NOTE — Interval H&P Note (Signed)
History and Physical Interval Note: Preprocedure H&P from 12/13/22  was reviewed and there was no interval change after seeing and examining the patient.  Written consent was obtained from the patient after discussion of risks, benefits, and alternatives. Patient has consented to proceed with Colonoscopy with possible intervention   12/13/2022 7:27 AM  Glen [name]  has presented today for surgery, with the diagnosis of Z12.11 (ICD-10-CM) - Colon cancer screening.  The various methods of treatment have been discussed with the patient and family. After consideration of risks, benefits and other options for treatment, the patient has consented to  Procedure(s) with comments: COLONOSCOPY WITH PROPOFOL (N/A) - DM as a surgical intervention.  The patient's history has been reviewed, patient examined, no change in status, stable for surgery.  I have reviewed the patient's chart and labs.  Questions were answered to the patient's satisfaction.     [name]

## 2022-12-13 NOTE — Transfer of Care (Signed)
Immediate Anesthesia Transfer of Care Note  Patient: Glen [name]  Procedure(s) Performed: COLONOSCOPY WITH PROPOFOL HEMOSTASIS CLIP PLACEMENT POLYPECTOMY  Patient Location: PACU  Anesthesia Type:General  Level of Consciousness: sedated  Airway & Oxygen Therapy: Patient Spontanous Breathing and Patient connected to nasal cannula oxygen  Post-op Assessment: Report given to RN and Post -op Vital signs reviewed and stable  Post vital signs: Reviewed and stable  Last Vitals:  Vitals Value Taken Time  BP 84/73 12/13/22 0807  Temp 36.1 C 12/13/22 0807  Pulse 82 12/13/22 0807  Resp 15 12/13/22 0807  SpO2 99 % 12/13/22 0807  Vitals shown include unfiled device data.  Last Pain:  Vitals:   12/13/22 0807  TempSrc: Tympanic  PainSc: Asleep         Complications: No notable events documented.

## 2022-12-13 NOTE — Op Note (Signed)
[facility] Gastroenterology Patient Name: Glen [name] Procedure Date: 12/13/2022 7:28 AM MRN: [id number] Account #: [id number] Date of Birth: 31-May-1965 Admit Type: Outpatient Age: 57 Room: [facility] ENDO ROOM 2 Gender: Male Note Status: Finalized Instrument Name: Colonoscope [id number] Procedure:             Colonoscopy Indications:           Screening for colorectal malignant neoplasm Providers:             Trenda Moots, DO Referring MD:          Rhona Leavens. Burnett Sheng, MD (Referring MD) Medicines:             Monitored Anesthesia Care Complications:         No immediate complications. Estimated blood loss:                         Minimal. Procedure:             Pre-Anesthesia Assessment:                        - Prior to the procedure, a History and Physical was                         performed, and patient medications and allergies were                         reviewed. The patient is competent. The risks and                         benefits of the procedure and the sedation options and                         risks were discussed with the patient. All questions                         were answered and informed consent was obtained.                         Patient identification and proposed procedure were                         verified by the physician, the nurse, the anesthetist                         and the technician in the endoscopy suite. Mental                         Status Examination: alert and oriented. Airway                         Examination: normal oropharyngeal airway and neck                         mobility. Respiratory Examination: clear to                         auscultation. CV Examination: RRR, no murmurs, no S3  or S4. Prophylactic Antibiotics: The patient does not                         require prophylactic antibiotics. Prior                         Anticoagulants: The patient has taken no anticoagulant                          or antiplatelet agents. ASA Grade Assessment: II - A                         patient with mild systemic disease. After reviewing                         the risks and benefits, the patient was deemed in                         satisfactory condition to undergo the procedure. The                         anesthesia plan was to use monitored anesthesia care                         (MAC). Immediately prior to administration of                         medications, the patient was re-assessed for adequacy                         to receive sedatives. The heart rate, respiratory                         rate, oxygen saturations, blood pressure, adequacy of                         pulmonary ventilation, and response to care were                         monitored throughout the procedure. The physical                         status of the patient was re-assessed after the                         procedure.                        After obtaining informed consent, the colonoscope was                         passed under direct vision. Throughout the procedure,                         the patient's blood pressure, pulse, and oxygen                         saturations were monitored continuously. The  Colonoscope was introduced through the anus and                         advanced to the the terminal ileum, with                         identification of the appendiceal orifice and IC                         valve. The colonoscopy was performed without                         difficulty. The patient tolerated the procedure well.                         The quality of the bowel preparation was evaluated                         using the BBPS [facility] Bowel Preparation Scale) with                         scores of: Right Colon = 3, Transverse Colon = 3 and                         Left Colon = 3 (entire mucosa seen well with no                         residual  staining, small fragments of stool or opaque                         liquid). The total BBPS score equals 9. The terminal                         ileum, ileocecal valve, appendiceal orifice, and                         rectum were photographed. Findings:      The perianal and digital rectal examinations were normal. Pertinent       negatives include normal sphincter tone.      The terminal ileum appeared normal. Estimated blood loss: none.      Retroflexion in the right colon was performed.      Multiple small-mouthed diverticula were found in the recto-sigmoid colon       and sigmoid colon. Estimated blood loss: none.      An 11 to 12 mm polyp was found in the rectum. The polyp was       pedunculated. The polyp was removed with a hot snare. Resection and       retrieval were complete. To prevent bleeding after the polypectomy, one       hemostatic clip was successfully placed (MR conditional). There was no       bleeding at the end of the procedure. Estimated blood loss was minimal.      Two sessile polyps were found in the rectum and sigmoid colon. The       polyps were 3 to 6 mm in size. These polyps were removed with a cold       snare. Resection and retrieval were  complete. Estimated blood loss was       minimal.      Seven sessile polyps were found in the rectum (5), transverse colon (1)       and ascending colon (1). The polyps were 1 to 2 mm in size. These polyps       were removed with a jumbo cold forceps. Resection and retrieval were       complete. Estimated blood loss was minimal.      The exam was otherwise without abnormality on direct and retroflexion       views. Impression:            - The examined portion of the ileum was normal.                        - Diverticulosis in the recto-sigmoid colon and in the                         sigmoid colon.                        - One 11 to 12 mm polyp in the rectum, removed with a                         hot snare. Resected and  retrieved. Clip (MR                         conditional) was placed.                        - Two 3 to 6 mm polyps in the rectum and in the                         sigmoid colon, removed with a cold snare. Resected and                         retrieved.                        - Seven 1 to 2 mm polyps in the rectum, in the                         transverse colon and in the ascending colon, removed                         with a jumbo cold forceps. Resected and retrieved.                        - The examination was otherwise normal on direct and                         retroflexion views. Recommendation:        - Patient has a contact number available for                         emergencies. The signs and symptoms of potential                         delayed complications were  discussed with the patient.                         Return to normal activities tomorrow. Written                         discharge instructions were provided to the patient.                        - Discharge patient to home.                        - Resume previous diet.                        - Continue present medications.                        - No ibuprofen, naproxen, or other non-steroidal                         anti-inflammatory drugs for 5 days after polyp removal.                        - Await pathology results.                        - Repeat colonoscopy in 3 years for surveillance based                         on pathology results.                        - Return to GI office as previously scheduled.                        - The findings and recommendations were discussed with                         the patient. Procedure Code(s):     --- Professional ---                        [phone], Colonoscopy, flexible; with removal of                         tumor(s), polyp(s), or other lesion(s) by snare                         technique                        [id number], 59, Colonoscopy, flexible; with biopsy,  single                         or multiple Diagnosis Code(s):     --- Professional ---                        Z12.11, Encounter for screening for malignant neoplasm                         of colon  D12.8, Benign neoplasm of rectum                        D12.5, Benign neoplasm of sigmoid colon                        D12.3, Benign neoplasm of transverse colon (hepatic                         flexure or splenic flexure)                        D12.2, Benign neoplasm of ascending colon                        K57.30, Diverticulosis of large intestine without                         perforation or abscess without bleeding CPT copyright 2022 American Medical Association. All rights reserved. The codes documented in this report are preliminary and upon coder review may  be revised to meet current compliance requirements. Attending Participation:      I personally performed the entire procedure. Elfredia Nevins, DO [name] DO, DO 12/13/2022 8:09:11 AM This report has been signed electronically. Number of Addenda: 0 Note Initiated On: 12/13/2022 7:28 AM Scope Withdrawal Time: 0 hours 18 minutes 56 seconds  Total Procedure Duration: 0 hours 23 minutes 50 seconds  Estimated Blood Loss:  Estimated blood loss was minimal.      [facility]

## 2022-12-13 NOTE — H&P (Signed)
Pre-Procedure H&P   Patient ID: Glen [name] is a 57 y.o. male.  Gastroenterology Provider: [name], DO  Referring Provider: Dr. Burnett Sheng PCP: Jerl Mina, MD  Date: 12/13/2022  HPI [name] is a 57 y.o. male who presents today for Colonoscopy for Colorectal cancer screening.  Undergoing initial colonoscopy today.  No family history of colon cancer or colon polyps.  Reports daily bowel movements without melena or hematochezia  Patient's last dose of Mounjaro was July 29.  Hemoglobin 15.8 MCV 89 platelets 252,000  No other acute GI complaints   Past Medical History:  Diagnosis Date   Diabetes mellitus without complication (HCC)    GERD (gastroesophageal reflux disease)    Hyperlipidemia    Hypertension    Sleep apnea    Testicular cancer (HCC)     Past Surgical History:  Procedure Laterality Date   BACK SURGERY     TESTICLE REMOVAL      Family History No h/o GI disease or malignancy  Review of Systems  Constitutional:  Negative for activity change, appetite change, chills, diaphoresis, fatigue, fever and unexpected weight change.  HENT:  Negative for trouble swallowing and voice change.   Respiratory:  Negative for shortness of breath and wheezing.   Cardiovascular:  Negative for chest pain, palpitations and leg swelling.  Gastrointestinal:  Negative for abdominal distention, abdominal pain, anal bleeding, blood in stool, constipation, diarrhea, nausea and vomiting.  Musculoskeletal:  Negative for arthralgias and myalgias.  Skin:  Negative for color change and pallor.  Neurological:  Negative for dizziness, syncope and weakness.  Psychiatric/Behavioral:  Negative for confusion. The patient is not nervous/anxious.   All other systems reviewed and are negative.    Medications No current facility-administered medications on file prior to encounter.   Current Outpatient Medications on File Prior to Encounter  Medication Sig  Dispense Refill   atenolol (TENORMIN) 25 MG tablet Take 1 tablet (25 mg total) by mouth daily. 30 tablet 0   esomeprazole (NEXIUM) 40 MG capsule Take 1 capsule by mouth daily.     rosuvastatin (CRESTOR) 10 MG tablet Take 1 tablet by mouth daily.     tirzepatide [facility]) 5 MG/0.5ML Pen Inject 5 mg into the skin once a week.     metFORMIN (GLUCOPHAGE-XR) 500 MG 24 hr tablet Take 2 tablets by mouth daily with supper. (Patient not taking: Reported on 12/07/2022)      Pertinent medications related to GI and procedure were reviewed by me with the patient prior to the procedure   Current Facility-Administered Medications:    0.9 %  sodium chloride infusion, , Intravenous, Continuous, [name], DO, Last Rate: 20 mL/hr at 12/13/22 0707, 20 mL/hr at 12/13/22 1914  sodium chloride 20 mL/hr (12/13/22 0707)       No Known Allergies Allergies were reviewed by me prior to the procedure  Objective   Body mass index is 27.8 kg/m. Vitals:   12/13/22 0655  BP: (!) 150/110  Pulse: 77  Resp: 20  Temp: (!) 97.2 F (36.2 C)  TempSrc: Temporal  SpO2: 100%  Weight: 93 kg  Height: 6' (1.829 m)     Physical Exam Vitals and nursing note reviewed.  Constitutional:      General: He is not in acute distress.    Appearance: Normal appearance. He is not ill-appearing, toxic-appearing or diaphoretic.  HENT:     Head: Normocephalic and atraumatic.     Nose: Nose normal.  Mouth/Throat:     Mouth: Mucous membranes are moist.     Pharynx: Oropharynx is clear.  Eyes:     General: No scleral icterus.    Extraocular Movements: Extraocular movements intact.  Cardiovascular:     Rate and Rhythm: Normal rate and regular rhythm.     Heart sounds: Normal heart sounds. No murmur heard.    No friction rub. No gallop.  Pulmonary:     Effort: Pulmonary effort is normal. No respiratory distress.     Breath sounds: Normal breath sounds. No wheezing, rhonchi or rales.  Abdominal:     General:  Bowel sounds are normal. There is no distension.     Palpations: Abdomen is soft.     Tenderness: There is no abdominal tenderness. There is no guarding or rebound.  Musculoskeletal:     Cervical back: Neck supple.     Right lower leg: No edema.     Left lower leg: No edema.  Skin:    General: Skin is warm and dry.     Coloration: Skin is not jaundiced or pale.  Neurological:     General: No focal deficit present.     Mental Status: He is alert and oriented to person, place, and time. Mental status is at baseline.  Psychiatric:        Mood and Affect: Mood normal.        Behavior: Behavior normal.        Thought Content: Thought content normal.        Judgment: Judgment normal.      Assessment:  [name] is a 57 y.o. male  who presents today for Colonoscopy for Colorectal cancer screening .  Plan:  Colonoscopy with possible intervention today  Colonoscopy with possible biopsy, control of bleeding, polypectomy, and interventions as necessary has been discussed with the patient/patient representative. Informed consent was obtained from the patient/patient representative after explaining the indication, nature, and risks of the procedure including but not limited to death, bleeding, perforation, missed neoplasm/lesions, cardiorespiratory compromise, and reaction to medications. Opportunity for questions was given and appropriate answers were provided. Patient/patient representative has verbalized understanding is amenable to undergoing the procedure.   [name], DO  [facility] Gastroenterology  Portions of the record may have been created with voice recognition software. Occasional wrong-word or 'sound-a-like' substitutions may have occurred due to the inherent limitations of voice recognition software.  Read the chart carefully and recognize, using context, where substitutions may have occurred.

## 2022-12-13 NOTE — Anesthesia Postprocedure Evaluation (Signed)
Anesthesia Post Note  Patient: Glen [name]  Procedure(s) Performed: COLONOSCOPY WITH PROPOFOL HEMOSTASIS CLIP PLACEMENT POLYPECTOMY  Patient location during evaluation: PACU Anesthesia Type: General Level of consciousness: awake and alert Pain management: pain level controlled Vital Signs Assessment: post-procedure vital signs reviewed and stable Respiratory status: spontaneous breathing, nonlabored ventilation and respiratory function stable Cardiovascular status: blood pressure returned to baseline and stable Postop Assessment: no apparent nausea or vomiting Anesthetic complications: no   No notable events documented.   Last Vitals:  Vitals:   12/13/22 0817 12/13/22 0827  BP: 98/65 113/81  Pulse: 89 80  Resp: 11 14  Temp:    SpO2: 99% 99%    Last Pain:  Vitals:   12/13/22 0827  TempSrc:   PainSc: 0-No pain                 Foye Deer

## 2022-12-14 ENCOUNTER — Encounter: Payer: Self-pay | Admitting: Gastroenterology

## 2023-01-27 ENCOUNTER — Ambulatory Visit: Payer: Self-pay | Admitting: Physician Assistant

## 2023-07-08 ENCOUNTER — Encounter: Payer: Self-pay | Admitting: Emergency Medicine

## 2023-07-08 ENCOUNTER — Emergency Department
Admission: EM | Admit: 2023-07-08 | Discharge: 2023-07-08 | Disposition: A | Discharge status: Home / Self Care | Payer: BC Managed Care – PPO | Attending: Emergency Medicine | Admitting: Emergency Medicine

## 2023-07-08 ENCOUNTER — Emergency Department: Payer: BC Managed Care – PPO

## 2023-07-08 ENCOUNTER — Other Ambulatory Visit: Payer: Self-pay

## 2023-07-08 DIAGNOSIS — I10 Essential (primary) hypertension: Secondary | ICD-10-CM | POA: Insufficient documentation

## 2023-07-08 DIAGNOSIS — E119 Type 2 diabetes mellitus without complications: Secondary | ICD-10-CM | POA: Diagnosis not present

## 2023-07-08 DIAGNOSIS — R42 Dizziness and giddiness: Secondary | ICD-10-CM | POA: Diagnosis not present

## 2023-07-08 DIAGNOSIS — R0602 Shortness of breath: Secondary | ICD-10-CM | POA: Insufficient documentation

## 2023-07-08 LAB — D-DIMER, QUANTITATIVE: D-Dimer, Quant: <0.27 ug{FEU}/mL (ref 0.00–0.50)

## 2023-07-08 LAB — BASIC METABOLIC PANEL
Anion gap: 10 (ref 5–15)
BUN: 11 mg/dL (ref 6–20)
CO2: 24 mmol/L (ref 22–32)
Calcium: 8.8 mg/dL — ABNORMAL LOW (ref 8.9–10.3)
Chloride: 104 mmol/L (ref 98–111)
Creatinine, Ser: 0.91 mg/dL (ref 0.61–1.24)
GFR, Estimated: >60 mL/min (ref >60)
Glucose, Bld: 223 mg/dL — ABNORMAL HIGH (ref 70–99)
Potassium: 3.3 mmol/L — ABNORMAL LOW (ref 3.5–5.1)
Sodium: 138 mmol/L (ref 135–145)

## 2023-07-08 LAB — CBC
HCT: 43.5 % (ref 39.0–52.0)
Hemoglobin: 14.6 g/dL (ref 13.0–17.0)
MCH: 30 pg (ref 26.0–34.0)
MCHC: 33.6 g/dL (ref 30.0–36.0)
MCV: 89.3 fL (ref 80.0–100.0)
Platelets: 222 10*3/uL (ref 150–400)
RBC: 4.87 MIL/uL (ref 4.22–5.81)
RDW: 11.7 % (ref 11.5–15.5)
WBC: 5.5 10*3/uL (ref 4.0–10.5)
nRBC: 0 % (ref 0.0–0.2)

## 2023-07-08 LAB — TROPONIN I (HIGH SENSITIVITY)
Troponin I (High Sensitivity): 7 ng/L (ref <18)
Troponin I (High Sensitivity): 7 ng/L (ref <18)

## 2023-07-08 NOTE — ED Provider Notes (Signed)
 [facility] Provider Note    Event Date/Time   First MD Initiated Contact with Patient 07/08/23 1305     (approximate)   History   Chief Complaint Shortness of Breath   HPI  Glen [name] is a 58 y.o. male with past medical history of hypertension, hyperlipidemia, and diabetes who presents to the ED complaining of shortness of breath.  Patient reports that he has had intermittent mild shortness of breath with some dizziness and lightheadedness for the past couple of days.  He had a more significant episode today while driving when he felt like he was going to pass out with some difficulty breathing.  He denies any associated chest pain, symptoms have improved since his arrival to the ED but he still feels "slightly off."  He has not noticed any pain or swelling in his legs and denies any fevers or cough.     Physical Exam   Triage Vital Signs: ED Triage Vitals  Encounter Vitals Group     BP 07/08/23 1239 (!) 163/107     Systolic BP Percentile --      Diastolic BP Percentile --      Pulse Rate 07/08/23 1239 97     Resp 07/08/23 1239 16     Temp 07/08/23 1239 98.5 F (36.9 C)     Temp Source 07/08/23 1239 Oral     SpO2 07/08/23 1239 99 %     Weight 07/08/23 1234 205 lb 0.4 oz (93 kg)     Height --      Head Circumference --      Peak Flow --      Pain Score 07/08/23 1234 0     Pain Loc --      Pain Education --      Exclude from Growth Chart --     Most recent vital signs: Vitals:   07/08/23 1239  BP: (!) 163/107  Pulse: 97  Resp: 16  Temp: 98.5 F (36.9 C)  SpO2: 99%    Constitutional: Alert and oriented. Eyes: Conjunctivae are normal. Head: Atraumatic. Nose: No congestion/rhinnorhea. Mouth/Throat: Mucous membranes are moist.  Cardiovascular: Normal rate, regular rhythm. Grossly normal heart sounds.  2+ radial pulses bilaterally. Respiratory: Normal respiratory effort.  No retractions. Lungs CTAB. Gastrointestinal: Soft and  nontender. No distention. Musculoskeletal: No lower extremity tenderness nor edema.  Neurologic:  Normal speech and language. No gross focal neurologic deficits are appreciated.    ED Results / Procedures / Treatments   Labs (all labs ordered are listed, but only abnormal results are displayed) Labs Reviewed  BASIC METABOLIC PANEL - Abnormal; Notable for the following components:      Result Value   Potassium 3.3 (*)    Glucose, Bld 223 (*)    Calcium 8.8 (*)    All other components within normal limits  CBC  D-DIMER, QUANTITATIVE  TROPONIN I (HIGH SENSITIVITY)  TROPONIN I (HIGH SENSITIVITY)     EKG  ED ECG REPORT I, Chesley Noon, the attending physician, personally viewed and interpreted this ECG.   Date: 07/08/2023  EKG Time: 12:37  Rate: 99  Rhythm: normal sinus rhythm  Axis: Normal  Intervals:none  ST&T Change: None  RADIOLOGY Chest x-ray reviewed and interpreted by me with no infiltrate, edema, or effusion.  PROCEDURES:  Critical Care performed: No  Procedures   MEDICATIONS ORDERED IN ED: Medications - No data to display   IMPRESSION / MDM / ASSESSMENT AND PLAN / ED COURSE  I reviewed the triage vital signs and the nursing notes.                              58 y.o. male with past medical history of hypertension, hyperlipidemia, and diabetes who presents to the ED complaining of intermittent shortness of breath and lightheadedness for the past couple of days.  Patient's presentation is most consistent with acute presentation with potential threat to life or bodily function.  Differential diagnosis includes, but is not limited to, ACS, arrhythmia, PE, pneumonia, pneumothorax, anemia, electrolyte abnormality, AKI.  Patient nontoxic-appearing and in no acute distress, vital signs are remarkable for hypertension but otherwise reassuring.  EKG shows no evidence arrhythmia or ischemia and initial troponin within normal limits, will observe on cardiac  monitor and check second set troponin.  Additional labs are reassuring with no significant anemia, leukocytosis, electrolyte abnormality, or AKI.  Chest x-ray results are pending, we will also check D-dimer to rule out PE given patient is low risk by Wells criteria.  D-dimer within normal limits and I doubt PE, repeat troponin pending at this time.  Patient turned over to oncoming provider pending troponin results, if this is unremarkable then he would be appropriate for discharge home with outpatient cardiology follow-up.   FINAL CLINICAL IMPRESSION(S) / ED DIAGNOSES   Final diagnoses:  Shortness of breath  Lightheadedness     Rx / DC Orders   ED Discharge Orders          Ordered    Ambulatory referral to Cardiology        07/08/23 1507             Note:  This document was prepared using Dragon voice recognition software and may include unintentional dictation errors.   Chesley Noon, MD 07/08/23 [phone]

## 2023-07-08 NOTE — Discharge Instructions (Addendum)
 Your potassium was mildly low when checked today.  Make sure to follow up with a primary doctor to follow up your labs.  Make sure to eat food high in potassium and magnesium - examples - potatoes, spinach, bananas, beans, avocadoes, oranges, nuts.  You are given information to follow-up with cardiology.  Call and follow-up closely with your primary care physician.  Return to the emergency department if you have any return of symptoms.

## 2023-07-08 NOTE — ED Triage Notes (Signed)
 C/O SOB and feeling dizzy while in parking lot today.  AAOx3.  Skin warm and dry.  NAD

## 2023-07-08 NOTE — ED Provider Notes (Signed)
 Care assumed of patient from outgoing provider.  See their note for initial history, exam and plan.  Clinical Course as of 07/08/23 1558  Fri Jul 08, 2023  1506 Shortness of breath and dizziness. EKG reassuring. Initial trop and dimer neg.  Second trop pending, if negative and symptoms resolved plan for cardiology follow up.  [SM]    Clinical Course User Index [SM] Corena Herter, MD  Serial troponin negative.  Plan for outpatient follow-up with cardiology.  Given return precautions.   Corena Herter, MD 07/08/23 [phone]
